# Patient Record
Sex: Male | Born: 1960 | Race: White | Hispanic: No | Marital: Married
Health system: Midwestern US, Community
[De-identification: ages and names within clinical notes are randomized; demographics above are authoritative.]

---

## 2018-02-21 NOTE — Telephone Encounter (Signed)
PT called [facility] needs to be seen for sleep and pulmonary. Can do 4/15, 16, 18 or 4/17 later in the day      [phone]

## 2018-02-25 NOTE — Telephone Encounter (Signed)
LMOM

## 2018-02-25 NOTE — Telephone Encounter (Signed)
04/18 @430

## 2018-02-28 ENCOUNTER — Encounter: Attending: Internal Medicine

## 2018-02-28 NOTE — Progress Notes (Deleted)
PULMONARY/SLEEP MEDICINE    Ricardo Mechnthony Bundick  Jul 17, 1961        02/27/2018       CHIEF COMPLAINT: had no chief complaint listed for this encounter.    HISTORY OF PRESENT ILLNESS: Ricardo Miller presents for had no chief complaint listed for this encounter.   ***    No past medical history on file.  No past surgical history on file.    I attest that current medications are reviewed and are accurate.    Allergies not on file  Social History     Socioeconomic History   ??? Marital status: MARRIED     Spouse name: Not on file   ??? Number of children: Not on file   ??? Years of education: Not on file   ??? Highest education level: Not on file     No family history on file.    REVIEW OF SYSTEMS:     no dyspnea.  no hemoptysis.  no cough.  no orthopnea.  no wheeze.  no sputum production. no fever, sweats, or chills.  no unusual fatigue.  no loss of appetite.  no weight loss more than 5 lbs.  no headaches.  no ear aches.  no eye irritation.  no blurred or double vision.  no nose or sinus problems, including hay fever.  no dry eyes or dry mouth.  no snoring.  no breast discomfort.  no chest pain.  no irregular or rapid heart beats.  no heartburn or indigestion.  no difficulty swallowing or regurgitation.  no nausea or vomiting.  no abdominl pain.  no diarrhea.  no constipation.  no difficult or painful urination.  no frequent urination.  no swelling at the ankles.  no joint pains or muscle aches.  no fingers turn white and painful in the cold.  no back pain or neck pain.  no automobile accident or other serios injury.  no unusual dizziness, faintness or loss of consciousness.  no numbness or weakness of part of your body.  no anxiety.  no depression.          PHYSICAL EXAM: There were no vitals taken for this visit.   General Appearance: NAD, pleasant, well built and nourished  HEENT: no thrush, no mucositis, TM's normal, turbinates normal, oropharynx normal  Oral Cavity: normal teeth, no lesions   Neck, Thyroid: supple, no lymphadenopathy, trachea at midline, no thyromegaly, JVP flat  Heart: regular rate and rhythm, S1, S2 without murmur.    Lungs: clear to auscultation, good air entry bilaterally.    Chest: normal shape and expansion, no use of accessory muscles, clear to percussion, normal fremitus  Abdomen: soft, NT/ND, BS present, no masses palpated, no hepatosplenomegaly  Extremities: no cyanosis, no clubbing, no edema.    Peripheral pulses: normal (2+) bilaterally  Neurologic: no focal signs, awake and alert, oriented x 3, normal cranial nerves II-XII sensory and motor wnl, DTR 2 plus, gait normal, normal sensation and strength  Lymph nodes not palpable  Skin: warm, dry, normal, no rash  Back: no midline or CVA tenderness  Lymphatics: lymphoedema absent      LABS AND TESTING:   No results found for this or any previous visit.    ASSESSMENT  {No Diagnosis Found}        PLAN    02/27/2018   ***    No orders of the defined types were placed in this encounter.      There are no discontinued medications.  There is no immunization history on file for this patient.      Mora Appl, MD

## 2023-02-21 IMAGING — MR MRI BRAIN WITH  IAC W/WO CONTRAST
11 of 17 series · 24 of 48 positions shown · IV contrast (gadavist)
Comparison: None.

________________________________________________________________________________________________ 
MRI BRAIN WITH  IAC W/WO CONTRAST,02/21/2023 [DATE]: 
CLINICAL INDICATION: Right ear hearing loss for one year. Tinnitus for 10 years.
TECHNIQUE: MRI performed without and with contrast using IAC protocol.  10.5 mL 
of Gadavist were injected intravenously by hand. 4.5 mL of Gadavist were 
discarded. Patient was scanned on a 1.5T magnet.

[Series 102: mpr - smartbrain · axial · 1.1mm · 1.09mm/px · 1 of 2 slices shown]
[im 1/2]
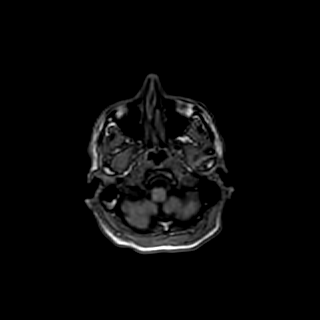

[Series 103: patient aligned mpr · axial · 21.9mm · 1.09mm/px · z∈[-142,+264]mm · 2 of 52 slices shown]
[im 1/52]
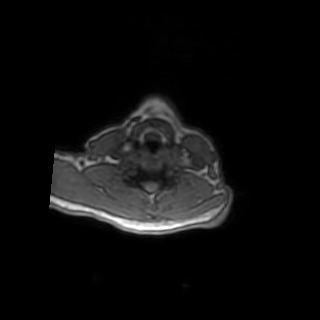
[im 52/52]
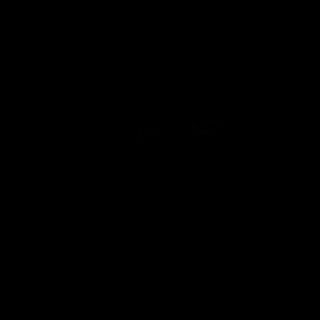

[Series 203: dadc map · axial · 5.0mm · 1.00mm/px · 1 of 23 slices shown (1 of 2)]
[im 1/23]
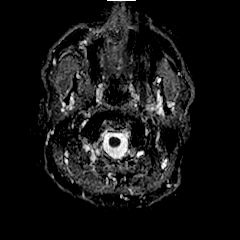

[Series 204: (id) · axial · 5.0mm · 1.00mm/px · 1 of 27 slices shown (1 of 2)]
[im 1/27]
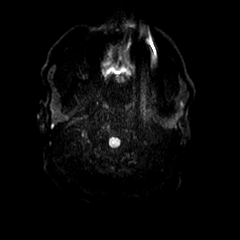

[Series 303: dadc map · coronal · 5.0mm · 0.81mm/px · 2 of 34 slices shown (2 of 2)]
[im 1/34]
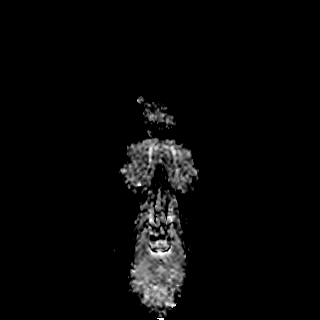
[im 34/34]
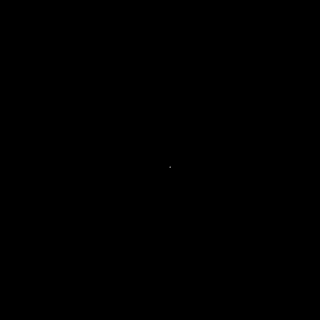

[Series 304: (id) · coronal · 5.0mm · 0.81mm/px · 1 of 34 slices shown (2 of 2)]
[im 1/34]
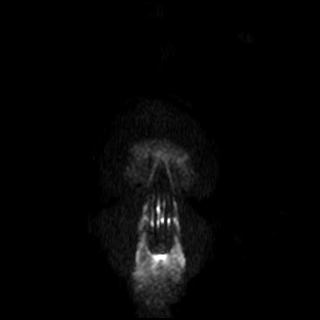

[Series 601: SWI · axial · 3.0mm · 0.40mm/px · z∈[-36,+110]mm · 11 of 200 slices shown]
[im 1/200]
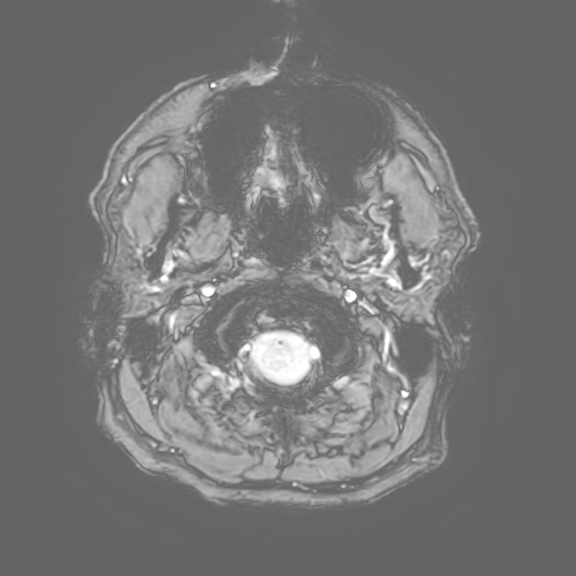
[im 20/200]
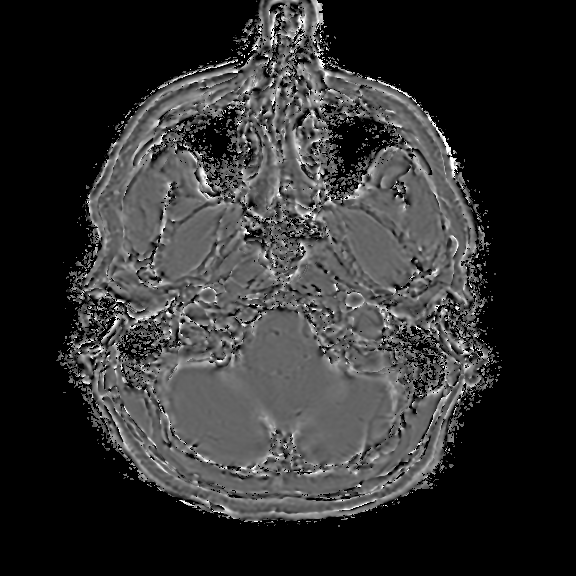
[im 40/200]
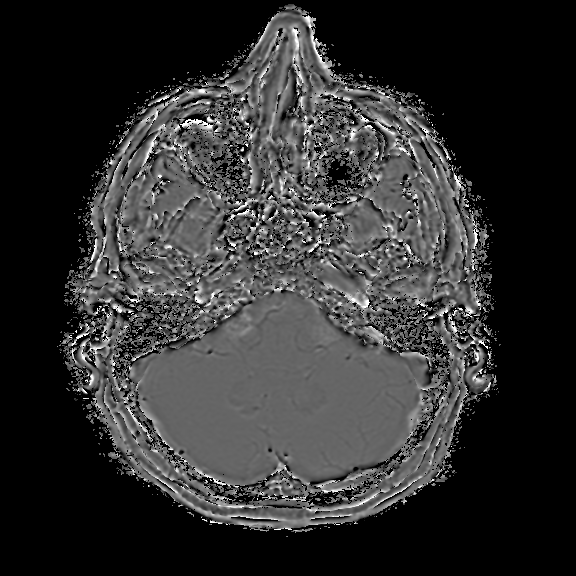
[im 60/200]
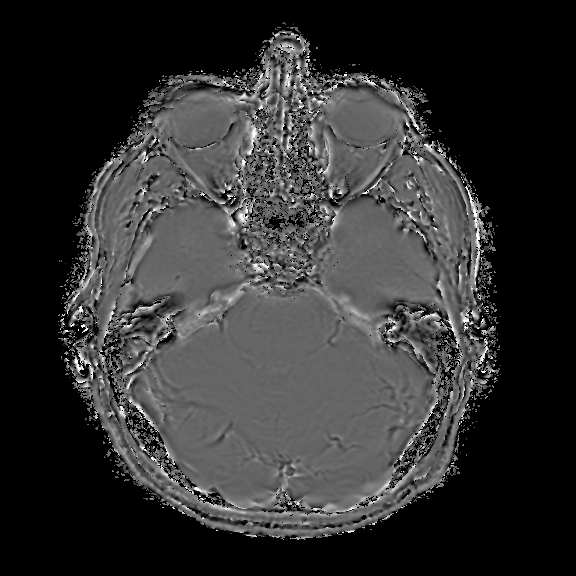
[im 80/200]
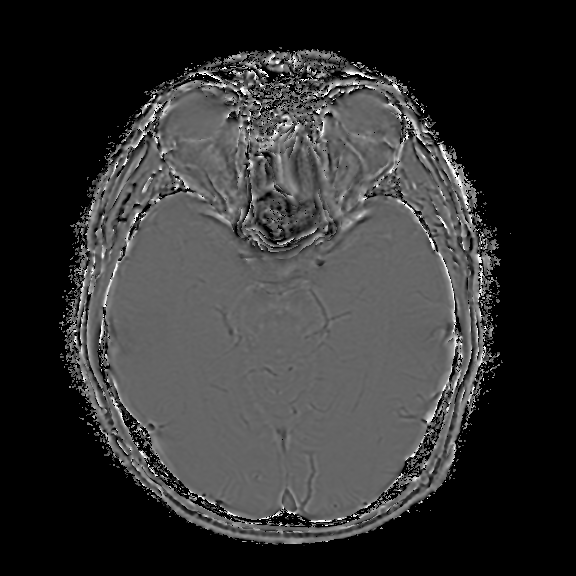
[im 100/200]
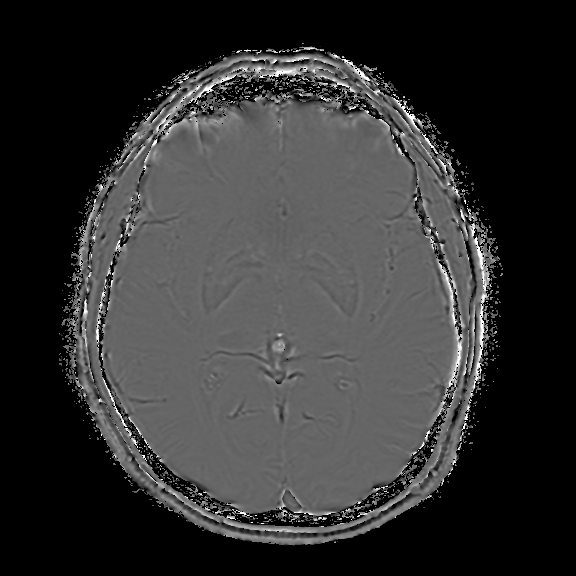
[im 120/200]
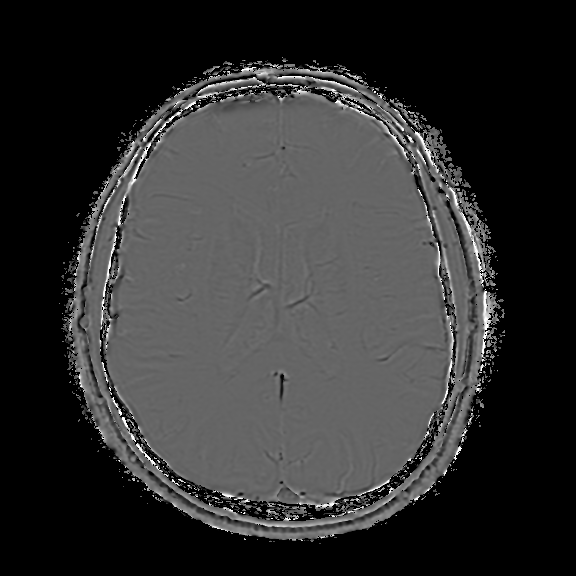
[im 140/200]
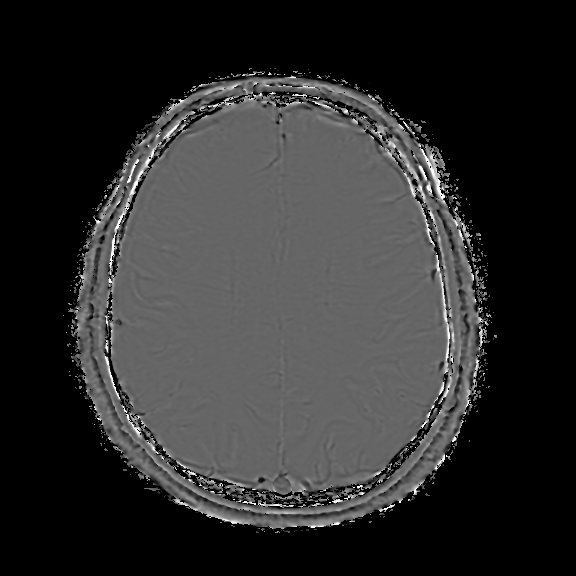
[im 160/200]
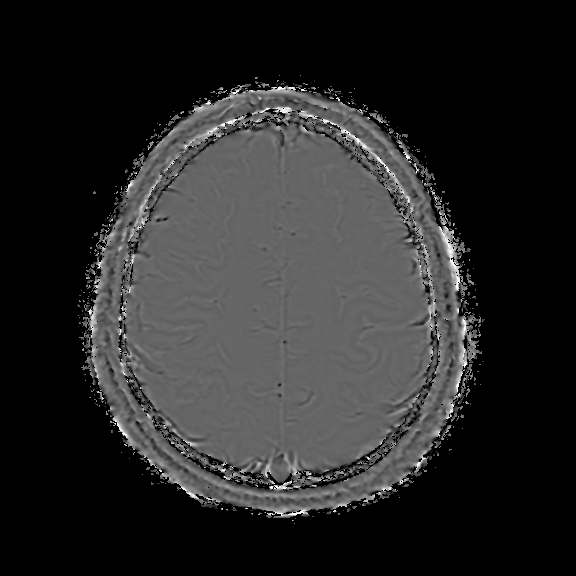
[im 180/200]
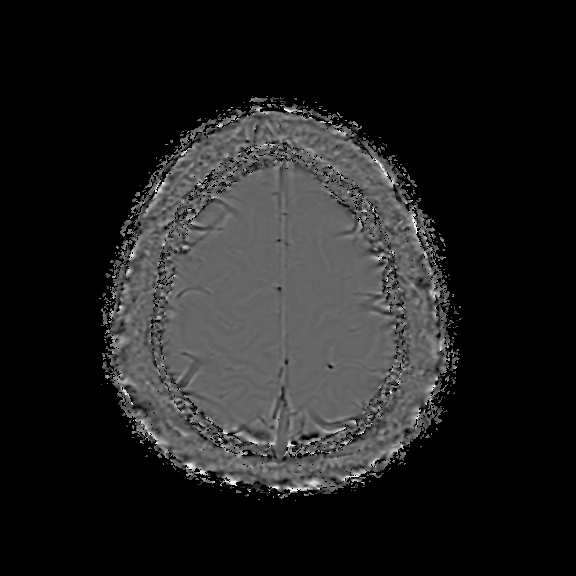
[im 200/200]
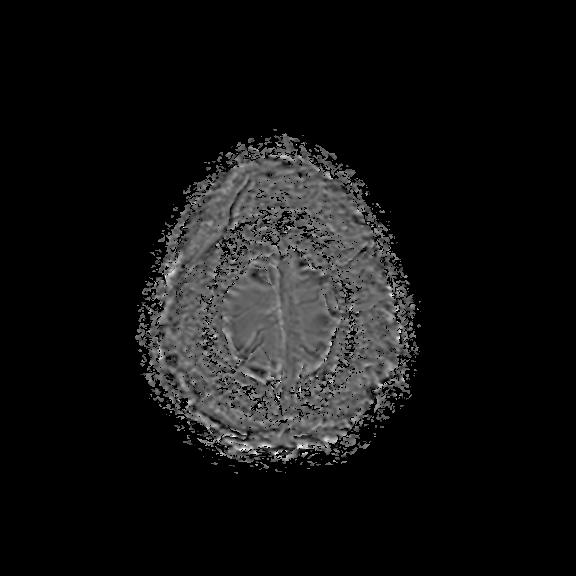

[Series 801: T1 · axial · 3.0mm · 0.59mm/px · 1 of 11 slices shown]
[im 1/11]
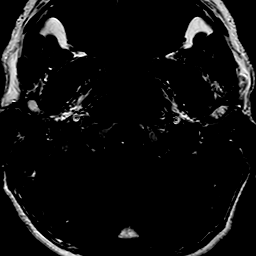

[Series 901: T1 fat-sat post-contrast · axial · 3.0mm · 0.59mm/px · 1 of 11 slices shown (1 of 2)]
[im 1/11]
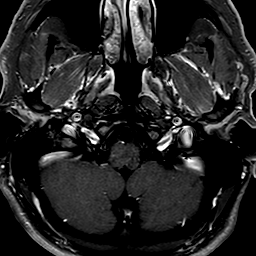

[Series 1001: T1 fat-sat post-contrast · coronal · 3.0mm · 0.59mm/px · 1 of 11 slices shown (2 of 2)]
[im 1/11]
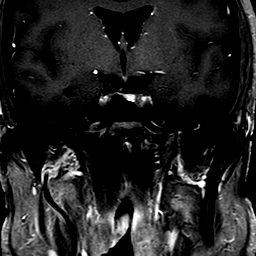

[Series 1101: T1 post-contrast · axial · 5.0mm · 0.51mm/px · z∈[-37,+116]mm · 2 of 27 slices shown]
[im 1/27]
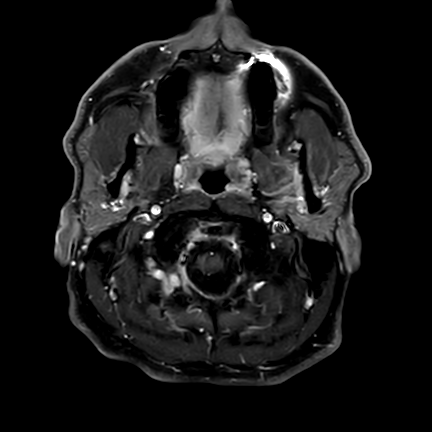
[im 27/27]
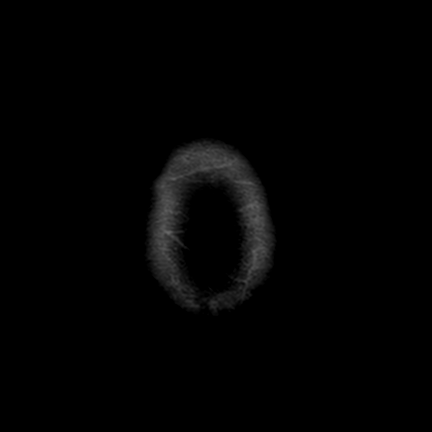

[24 of 48 positions shown; findings below may reference images not displayed]

FINDINGS: -------------------------------------------------------------------------------- 
------ 
INTERNAL AUDITORY CANALS:  
Dedicated imaging of the internal auditory canals demonstrates normal cranial 
nerve VIl and Tri complexes bilaterally. No cerebellopontine angle or internal 
auditory canal mass. No pathologic cranial nerve or temporal bone contrast 
enhancement. Inner ear structures demonstrate expected fluid signal intensity. 
No middle ear or mastoid effusion. 
-------------------------------------------------------------------------------- 
------ 
INTRACRANIAL:  
No acute ischemia. Punctate focus of susceptibility artifact along the ependymal 
surface of the left lateral ventricle adjacent to the left caudate head, likely 
calcification. Patency of intracranial vascular flow voids. Focus of T2 
prolongation along the left periatrial white matter. No acute intracranial 
hemorrhage, mass effect, midline shift. No pathologic enhancement in the brain. 
-------------------------------------------------------------------------------- 
------ 
OTHER: 
ORBITS/SINUSES:  Visualized orbits show no acute abnormality or mass.  
Visualized paranasal sinuses are clear. 
MARROW SIGNAL/SOFT TISSUES: No focal suspect signal abnormality. 
-------------------------------------------------------------------------------- 
------
IMPRESSION: 1.  No acute abnormality, mass, pathologic enhancement in the brain or internal 
auditory canals. 
2.  Punctate focus of T2 prolongation along the left periatrial white matter 
likely from focal white matter microangiopathic change.

## 2023-07-23 IMAGING — MR MRI CERVICAL SPINE WITHOUT CONTRAST
7 of 10 series · 13 of 48 positions shown · IV contrast (gadolinium)
Comparison: None.

________________________________________________________________________________________________ 
MRI CERVICAL SPINE WITHOUT CONTRAST, 07/23/2023 [DATE]: 
CLINICAL INDICATION: Chronic neck pain with radiation down bilateral arms.
TECHNIQUE: Multiplanar, multiecho position MR images of the cervical spine were 
performed without intravenous gadolinium enhancement. Patient was scanned on a 
1.5T magnet.

[Series 201: survey · axial · 10.0mm · 1.25mm/px · 1 of 10 slices shown]
[im 1/10]
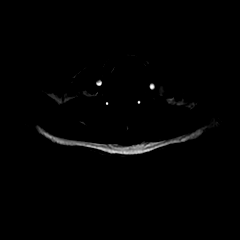

[Series 301: t2w_cor-surv · coronal · 5.0mm · 0.69mm/px · 1 of 7 slices shown]
[im 1/7]
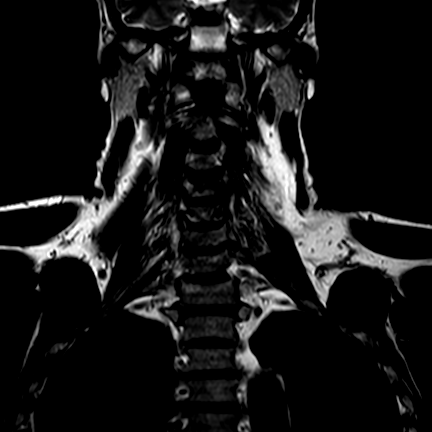

[Series 401: T1 · sagittal · 3.0mm · 0.33mm/px · 2 of 19 slices shown]
[im 1/19]
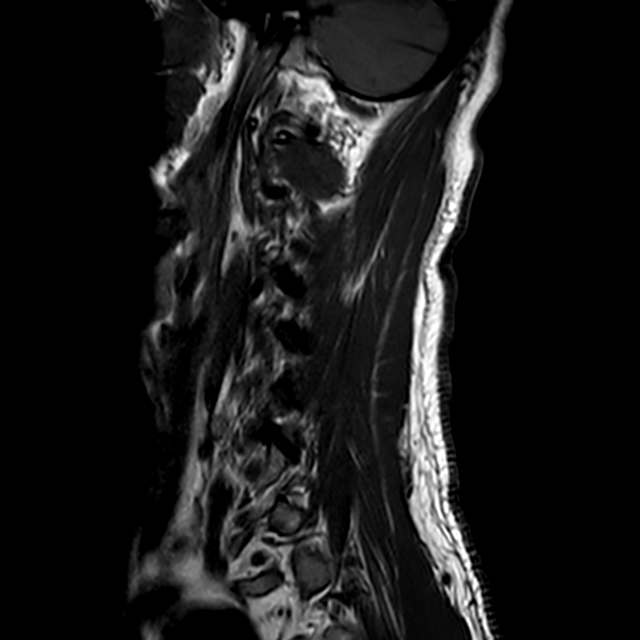
[im 19/19]
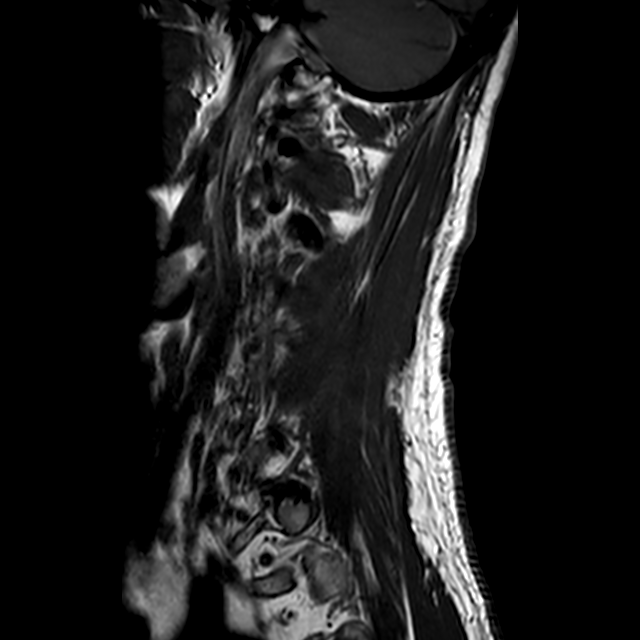

[Series 502: (id)_mdixon_tse · sagittal · 3.0mm · 0.41mm/px · 2 of 19 slices shown]
[im 1/19]
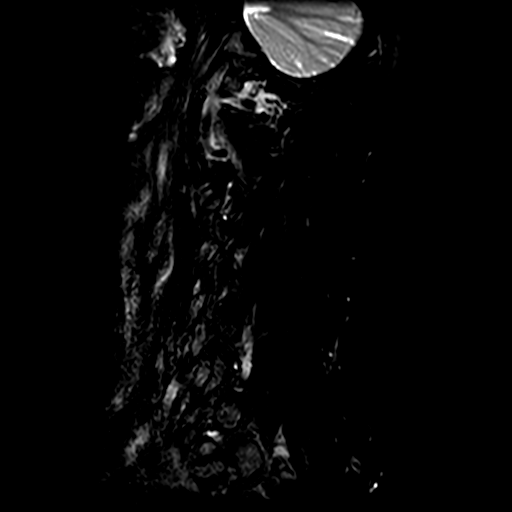
[im 19/19]
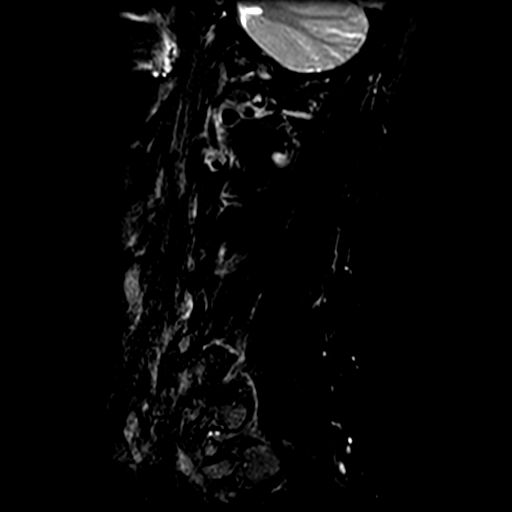

[Series 503: st2w_mdixon_tse · sagittal · 3.0mm · 0.41mm/px · 2 of 19 slices shown]
[im 1/19]
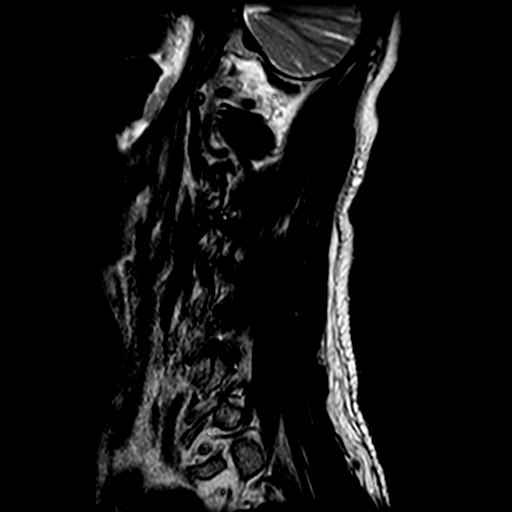
[im 19/19]
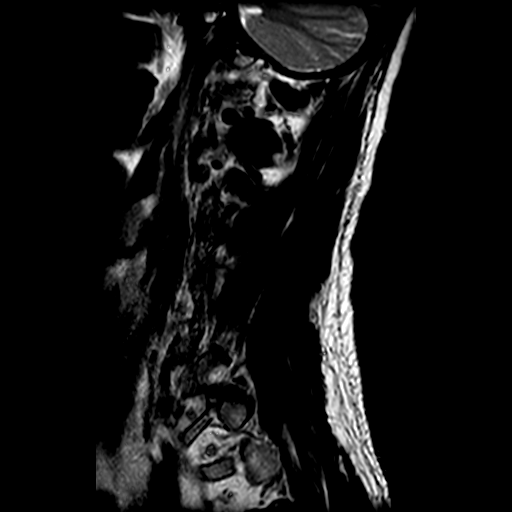

[Series 701: T2 · axial · 3.0mm · 0.29mm/px · z∈[-222,-139]mm · 2 of 18 slices shown]
[im 1/18]
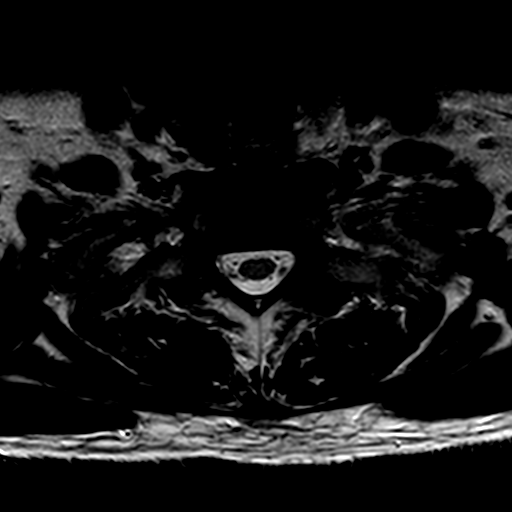
[im 18/18]
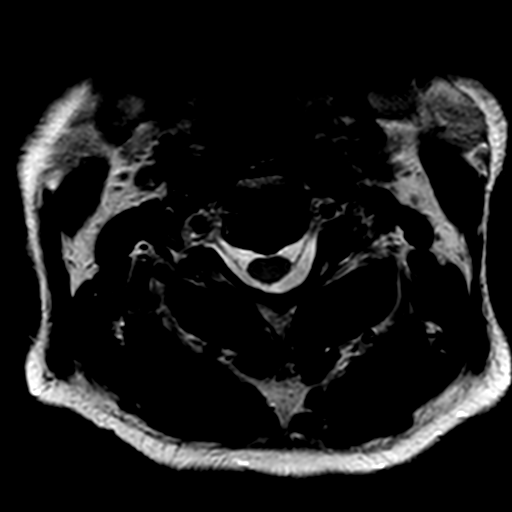

[Series 802: (person_name)_(person_name)_stack · axial · 3.0mm · 0.45mm/px · z∈[-222,-112]mm · 3 of 34 slices shown]
[im 1/34]
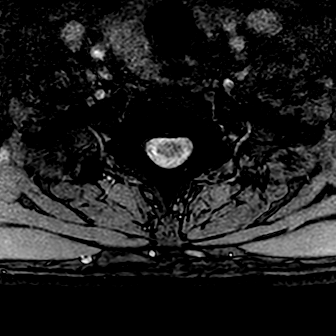
[im 17/34]
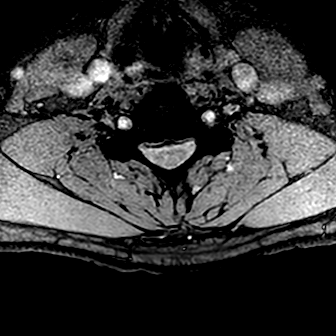
[im 34/34]
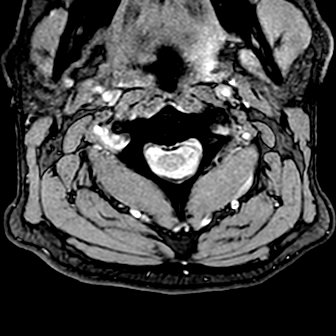

[13 of 48 positions shown; findings below may reference images not displayed]

FINDINGS: -------------------------------------------------------------------------------- 
----------------- 
GENERAL: 
ALIGNMENT: Straightening to mild reversal of normal cervical lordosis. Mild 
retrolisthesis of C3 on C4, C4 on C5, C5 on C6, C6 on C7. Mild anterolisthesis 
of C7 on T1. 
VERTEBRAL BODY HEIGHT: Normal.  
MARROW SIGNAL: Active endplate change at the C4-C5 level. 
CORD SIGNAL: Normal.  
ADDITIONAL FINDINGS: None. 
-------------------------------------------------------------------------------- 
---------------- 
SEGMENTAL: 
CRANIOCERVICAL JUNCTION: No significant stenosis. 
C2-C3: Central disc herniation. Left facet hypertrophy. Mild deformity of 
ventral cord. Mild central canal narrowing. No significant neural foraminal 
narrowing. 
C3-C4: Disc osteophyte complex with bilateral uncovertebral joint hypertrophy. 
Effacement of ventral and dorsal CSF space with deformity of the cord, 
especially along left ventral margin of the cord. Severe bilateral neural 
foraminal narrowing. 
C4-C5: Disc osteophyte complex eccentric to the right with bilateral 
uncovertebral joint hypertrophy. Deformity of the cord with effacement of 
ventral and dorsal CSF spaces. Moderate central canal narrowing. Severe 
bilateral neural foraminal narrowing. 
C5-C6: Mild uncovering of the disc space with disc osteophyte complex. Bilateral 
uncovertebral joint hypertrophy. Mild central canal narrowing. Severe bilateral 
neural foraminal narrowing. 
C6-C7: Mild uncovering of the disc space with disc osteophyte complex. Left 
uncovertebral joint hypertrophy greater than right. Mild central canal 
narrowing. Severe left neural foraminal narrowing. Moderate right neural 
foraminal narrowing. 
C7-T1: Disc osteophyte complex with bilateral facet hypertrophy. No significant 
central canal narrowing. Mild right and no significant left neural foraminal 
narrowing. 
-------------------------------------------------------------------------------- 
---------------
IMPRESSION: 1.  Discogenic/degenerative changes as above. 
2.  Cord deformity with moderate central canal narrowing at C3-C4 and C4-C5. 
3.  Cord deformity is also noted at C3-C4. 
4.  Severe neural foraminal narrowing at C3-C4, C4-C5, C5-C6, C6-C7.

## 2023-07-23 IMAGING — DX CERVICAL SPINE 4 VIEWS
1 series · 4 of 4 positions shown · non-contrast
Comparison: MRI cervical spine July 23, 2023. Brain MRI February 21, 2023.

________________________________________________________________________________________________ 
CERVICAL SPINE 4 VIEWS, 07/23/2023 [DATE]: 
CLINICAL INDICATION: Radiculopathy, cervical region , chronic neck pain.

[Series 1: lateral · 0.14mm/px · 4 of 4 slices shown]
[im 1/4]
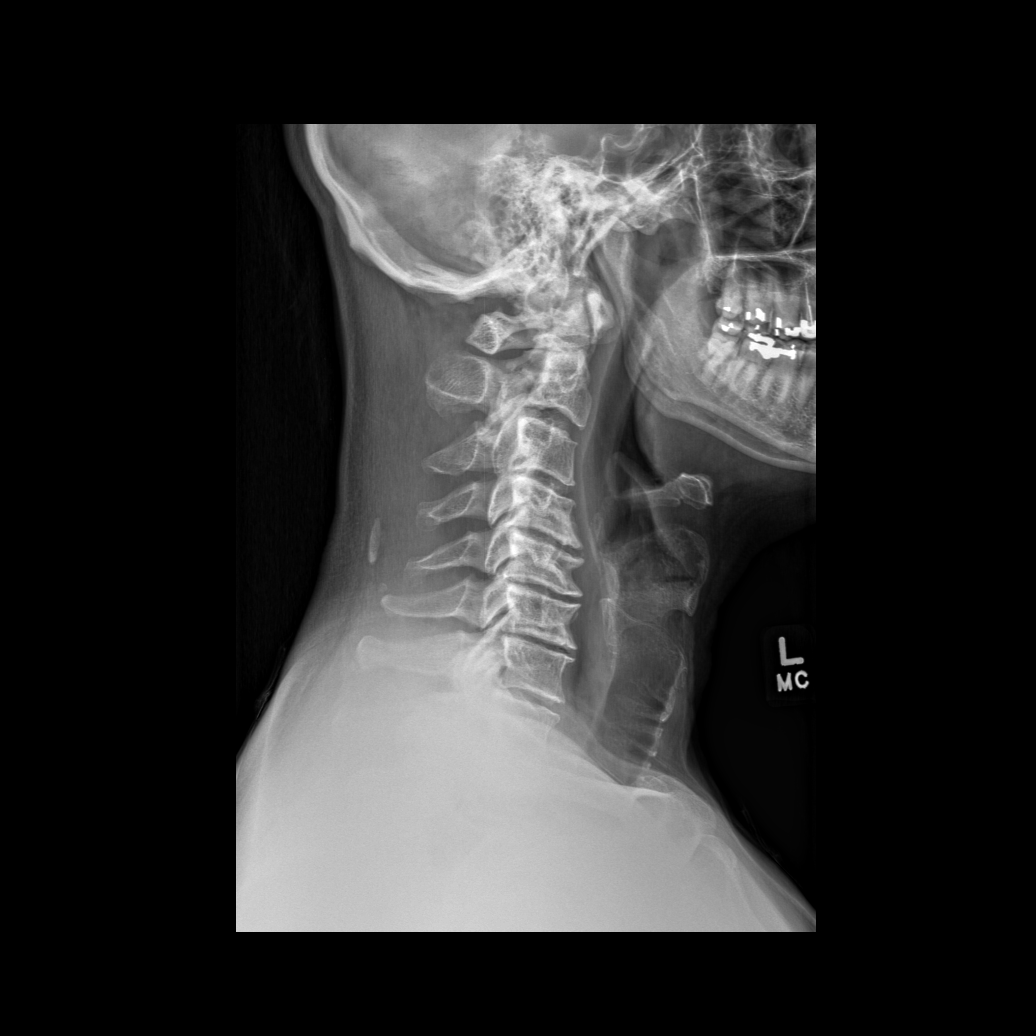
[im 2/4]
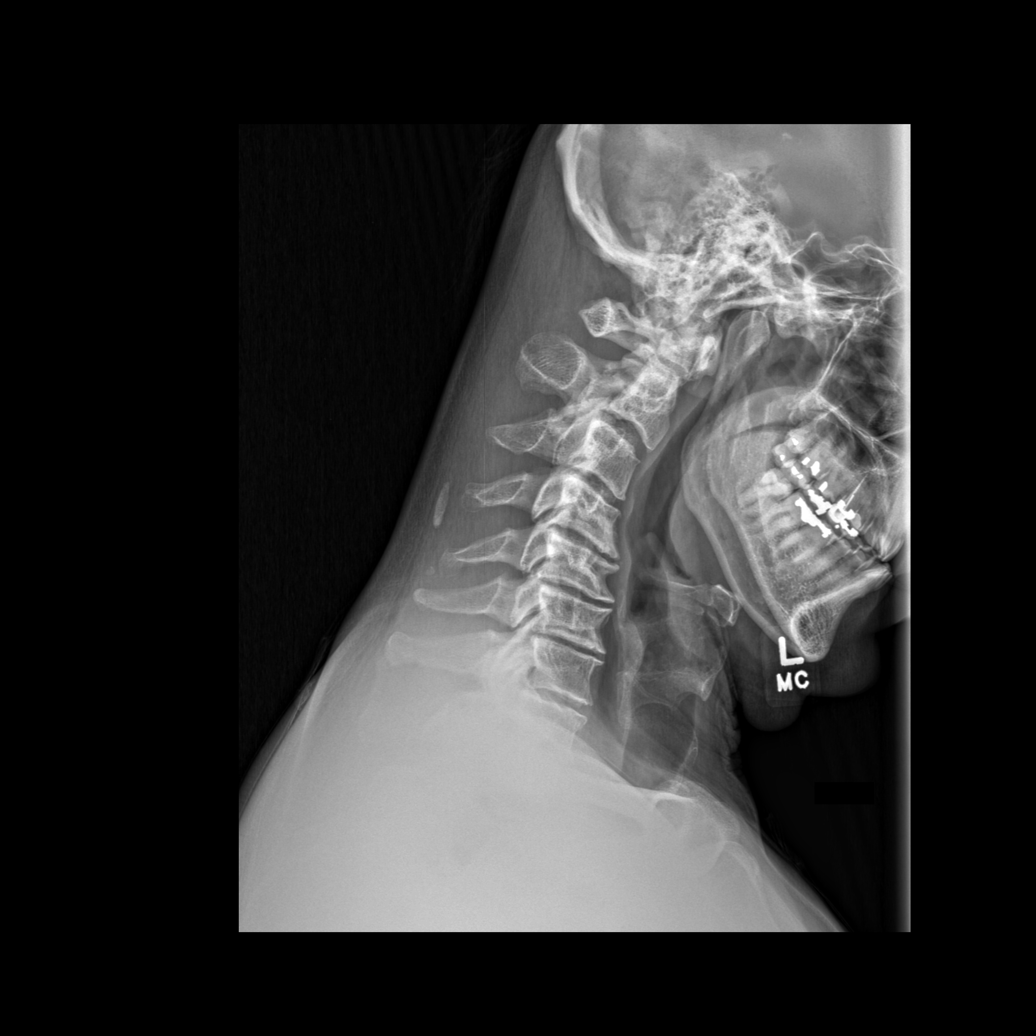
[im 3/4]
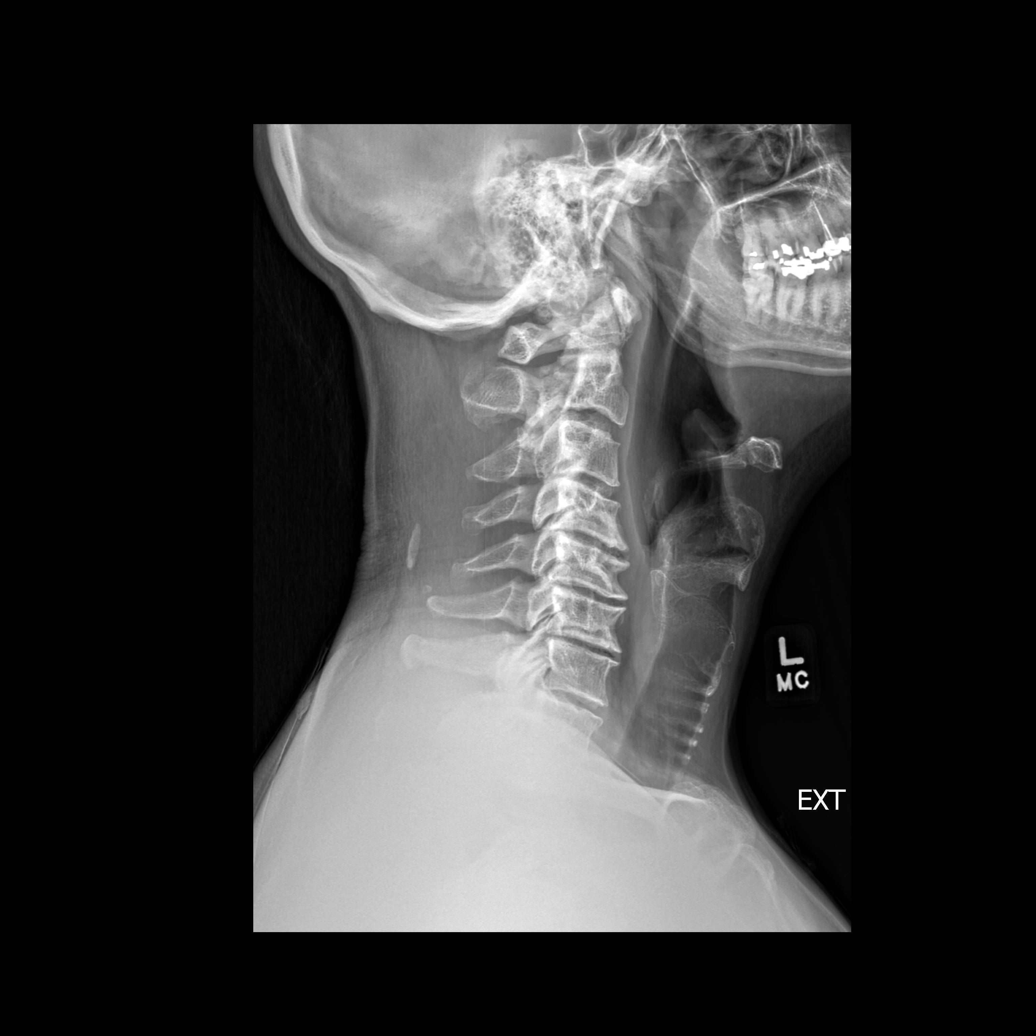
[im 4/4]
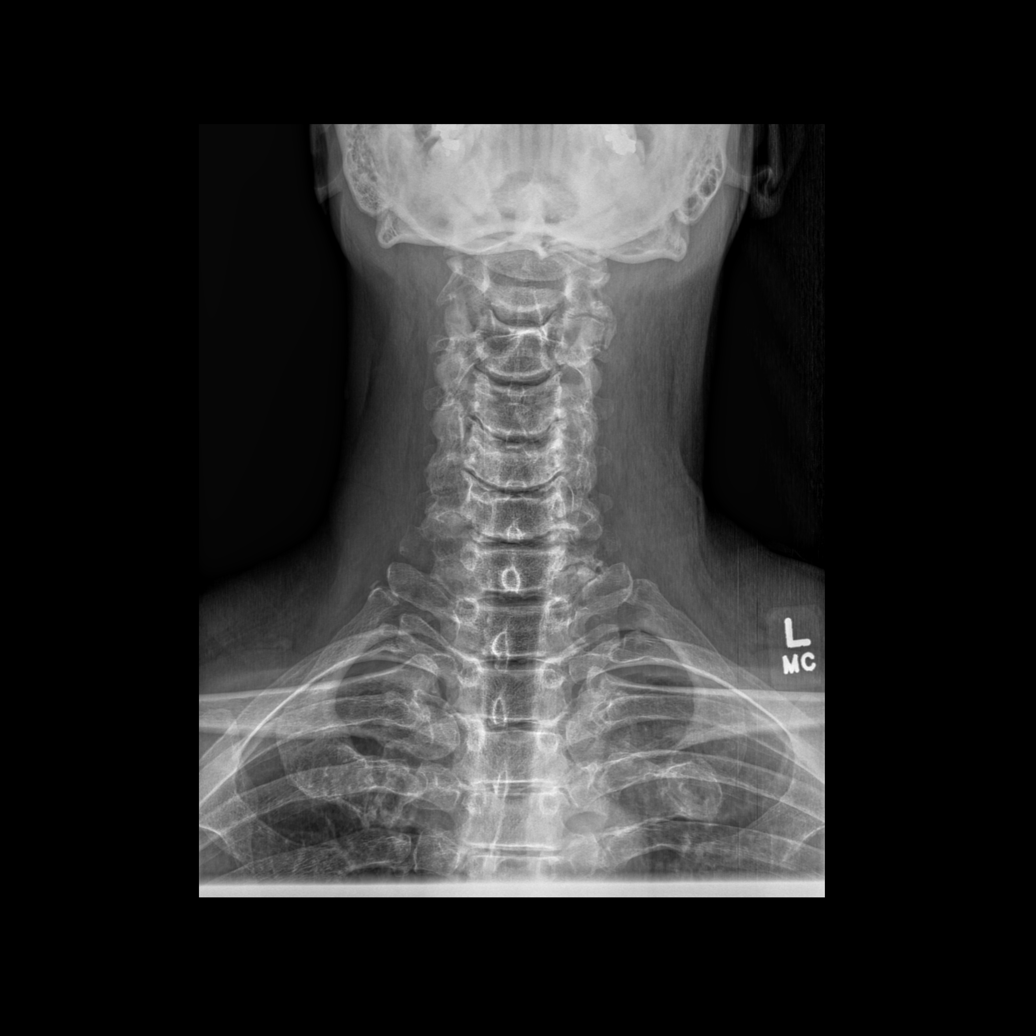

[4 of 4 positions shown; findings below may reference images not displayed]

FINDINGS: The lateral view adequately evaluates through the cervical thoracic 
junction. Abnormal sagittal alignment with accentuated cervical kyphosis. One MM 
anterior subluxation C2 on C3 develops with flexion, and reduces with extension 
consistent with a degree of dynamic instability. Vertebral body height 
preserved. No fracture. Multilevel loss of disc height most significant at C4-C5 
through C6-C7. Anterior marginal osteophytes. Ossification in the nuchal 
ligament. Multilevel uncinate spurring and articular pillar hypertrophic 
formation. Prevertebral soft tissues, airway, and lung apices are clear.
IMPRESSION: Cervical spondylosis with a mild degree of dynamic instability C2 on C3 as 
detailed above. Correlate with MRI cervical spine July 23, 2023.
